# Patient Record
Sex: Female | Born: 1975
Health system: Southern US, Community
[De-identification: ages and names within clinical notes are randomized; demographics above are authoritative.]

## PROBLEM LIST (undated history)

## (undated) DIAGNOSIS — Z8719 Personal history of other diseases of the digestive system: Secondary | ICD-10-CM

## (undated) DIAGNOSIS — Z8619 Personal history of other infectious and parasitic diseases: Secondary | ICD-10-CM

## (undated) HISTORY — DX: Personal history of other diseases of the digestive system: Z87.19

## (undated) HISTORY — DX: Personal history of other infectious and parasitic diseases: Z86.19

## (undated) HISTORY — PX: WISDOM TOOTH EXTRACTION: SHX21

---

## 2011-08-20 DIAGNOSIS — Z8719 Personal history of other diseases of the digestive system: Secondary | ICD-10-CM

## 2011-08-20 HISTORY — DX: Personal history of other diseases of the digestive system: Z87.19

## 2015-01-18 LAB — HM PAP SMEAR

## 2015-02-06 ENCOUNTER — Other Ambulatory Visit: Payer: Self-pay | Admitting: Family Medicine

## 2015-02-06 DIAGNOSIS — Z1231 Encounter for screening mammogram for malignant neoplasm of breast: Secondary | ICD-10-CM

## 2015-02-07 ENCOUNTER — Ambulatory Visit
Admission: RE | Admit: 2015-02-07 | Discharge: 2015-02-07 | Disposition: A | Discharge status: Home / Self Care | Payer: BLUE CROSS/BLUE SHIELD | Source: Ambulatory Visit | Attending: Family Medicine | Admitting: Family Medicine

## 2015-02-07 DIAGNOSIS — Z1231 Encounter for screening mammogram for malignant neoplasm of breast: Secondary | ICD-10-CM

## 2016-03-08 ENCOUNTER — Other Ambulatory Visit: Payer: Self-pay | Admitting: Family Medicine

## 2016-03-08 DIAGNOSIS — Z1231 Encounter for screening mammogram for malignant neoplasm of breast: Secondary | ICD-10-CM

## 2016-03-13 ENCOUNTER — Ambulatory Visit
Admission: RE | Admit: 2016-03-13 | Discharge: 2016-03-13 | Disposition: A | Discharge status: Home / Self Care | Payer: No Typology Code available for payment source | Source: Ambulatory Visit | Attending: Family Medicine | Admitting: Family Medicine

## 2016-03-13 DIAGNOSIS — Z1231 Encounter for screening mammogram for malignant neoplasm of breast: Secondary | ICD-10-CM

## 2016-12-02 ENCOUNTER — Encounter: Payer: Self-pay | Admitting: Family Medicine

## 2016-12-02 ENCOUNTER — Ambulatory Visit (INDEPENDENT_AMBULATORY_CARE_PROVIDER_SITE_OTHER): Payer: BLUE CROSS/BLUE SHIELD | Admitting: Family Medicine

## 2016-12-02 VITALS — BP 110/78 | HR 66 | Temp 98.3°F | Ht 65.5 in | Wt 145.0 lb

## 2016-12-02 DIAGNOSIS — Z0000 Encounter for general adult medical examination without abnormal findings: Secondary | ICD-10-CM | POA: Diagnosis not present

## 2016-12-02 LAB — HEMOGLOBIN A1C: Hgb A1c MFr Bld: 5.5 % (ref 4.6–6.5)

## 2016-12-02 LAB — LIPID PANEL
Cholesterol: 192 mg/dL (ref 0–200)
HDL: 74.8 mg/dL (ref >39.00)
LDL Cholesterol: 99 mg/dL (ref 0–99)
NonHDL: 117.1
Total CHOL/HDL Ratio: 3
Triglycerides: 91 mg/dL (ref 0.0–149.0)
VLDL: 18.2 mg/dL (ref 0.0–40.0)

## 2016-12-02 LAB — COMPREHENSIVE METABOLIC PANEL
ALT: 12 U/L (ref 0–35)
AST: 19 U/L (ref 0–37)
Albumin: 3.9 g/dL (ref 3.5–5.2)
Alkaline Phosphatase: 39 U/L (ref 39–117)
BUN: 9 mg/dL (ref 6–23)
CO2: 26 mEq/L (ref 19–32)
Calcium: 9.1 mg/dL (ref 8.4–10.5)
Chloride: 104 mEq/L (ref 96–112)
Creatinine, Ser: 0.73 mg/dL (ref 0.40–1.20)
GFR: 112.35 mL/min (ref >60.00)
Glucose, Bld: 82 mg/dL (ref 70–99)
Potassium: 4.1 mEq/L (ref 3.5–5.1)
Sodium: 138 mEq/L (ref 135–145)
Total Bilirubin: 0.4 mg/dL (ref 0.2–1.2)
Total Protein: 7.2 g/dL (ref 6.0–8.3)

## 2016-12-02 NOTE — Progress Notes (Signed)
Pre visit review using our clinic review tool, if applicable. No additional management support is needed unless otherwise documented below in the visit note. 

## 2016-12-02 NOTE — Patient Instructions (Signed)
Claritin (loratadine), Allegra (fexofenadine), Zyrtec (cetirizine); these are listed in order from weakest to strongest. Generic, and therefore cheaper, options are in the parentheses.   Flonase (fluticasone); nasal spray that is over the counter. 2 sprays each nostril, once daily. Aim towards the same side eye when you spray.  There are available OTC, and the generic versions, which may be cheaper, are in parentheses. Show this to a pharmacist if you have trouble finding any of these items.  Give [place] 2-3 business days to get the results of your labs back.

## 2016-12-02 NOTE — Progress Notes (Signed)
Chief Complaint  Patient presents with  . Establish Care    pt requesting CPE    New to clinic.  Well Woman Tara Mcdonald is here for a complete physical.   Her last physical was >1 year ago.  Current diet: in general, a "healthy" diet but could be better. Current exercise: walk every day. Weight is stable and she denies daytime fatigue. Patient's last menstrual period was 10/31/2016 (exact date).  Follow with a Dentist? Yes.   Follow with an Optometrist? Yes.   Seatbelt? Yes  Health Maintenance Pap/HPV- Yes - 01/2015 Mammogram- Yes- 726//2017 Tetanus- Yes 01/2015 HIV- Yes  Past Medical History:  Diagnosis Date  . History of chicken pox   . History of fatty infiltration of liver 2013    Past Surgical History:  Procedure Laterality Date  . WISDOM TOOTH EXTRACTION     Medications  Kariva 0.15-0.02/0.01 mg 21/5 tab daily   Allergies Allergies  Allergen Reactions  . Tetracyclines & Related Hives    Review of Systems: Constitutional:  no unexpected change in weight, no weakness, no unexplained fevers, sweats, or chills Eye:  no recent significant change in vision Ear/Nose/Mouth/Throat:  Ears:  no tinnitus or vertigo and no recent change in hearing, Nose/Mouth/Throat:  no complaints of nasal congestion or discharge, no sore throat and no recent change in voice or hoarseness Cardiovascular:  no exercise intolerance, no chest pain, no palpitations Respiratory:  no chronic cough, sputum, or hemoptysis and no shortness of breath Gastrointestinal:  no abdominal pain, no change in bowel habits, no significant change in appetite, no nausea, vomiting, diarrhea, or constipation and no black or bloody stool GU:  Female: negative for dysuria, frequency, and incontinence, Normal menses; no abnormal bleeding, pelvic pain, or discharge Musculoskeletal/Extremities:  no pain, redness, or swelling of the joints Integumentary (Skin/Breast):  no abnormal skin lesions reported, no new breast  lumps or masses Neurologic:  no chronic headaches, no numbness, tingling, or tremor Psychiatric:  no anxiety, no depression Endocrine:  denies fatigue, weight changes, heat/cold intolerance, bowel or skin changes, or cardiovascular system symptoms Hematologic/Lymphatic:  no abnormal bleeding, no HIV risk factors, no night sweats, no swollen nodes, no weight loss Allergic/Immunologic:  no history of food or environmental allergies  Exam BP 110/78 (BP Location: Left Arm, Patient Position: Sitting, Cuff Size: Normal)   Pulse 66   Temp 98.3 F (36.8 C) (Oral)   Ht 5' 5.5" (1.664 m)   Wt 145 lb (65.8 kg)   LMP 10/31/2016 (Exact Date)   SpO2 99%   BMI 23.76 kg/m  General:  well developed, well nourished, in no apparent distress Skin:  no significant moles, warts, or growths Head:  no masses, lesions, or tenderness Eyes:  pupils equal and round, sclera anicteric without injection Ears:  canals without lesions, TMs shiny without retraction, no obvious effusion, no erythema Nose:  nares patent, septum midline, mucosa normal, and no drainage or sinus tenderness Throat/Pharynx:  lips and gingiva without lesion; tongue and uvula midline; non-inflamed pharynx; no exudates or postnasal drainage Neck: neck supple without adenopathy, thyromegaly, or masses Breasts: Declined Thorax:  nontender Lungs:  clear to auscultation, breath sounds equal bilaterally, no respiratory distress Cardio:  regular rate and rhythm without murmurs, heart sounds without clicks or rubs, point of maximal impulse normal; no lifts, heaves, or thrills Abdomen:  abdomen soft, nontender; bowel sounds normal; no masses or organomegaly Genital: Not due Musculoskeletal:  symmetrical muscle groups noted without atrophy or deformity Extremities:  no clubbing,  cyanosis, or edema, no deformities, no skin discoloration Neuro:  gait normal; deep tendon reflexes normal and symmetric Psych: well oriented with normal range of affect and  appropriate judgment/insight  Assessment and Plan  Well adult exam - Plan: Comprehensive metabolic panel, Lipid panel, HgB A1c   Well 41 y.o. female.  Counseled on diet and exercise. Challenged to start lifting weights. Other orders as above. OTC allergy medicine options provided in AVS. Pap next year after June. Will complete co-testing. Follow up in 1 year pending above. The patient voiced understanding and agreement to the plan.  Jilda Roche Anthon, DO 12/02/16 2:10 PM

## 2017-06-25 ENCOUNTER — Other Ambulatory Visit: Payer: Self-pay | Admitting: Family Medicine

## 2017-06-25 DIAGNOSIS — Z1231 Encounter for screening mammogram for malignant neoplasm of breast: Secondary | ICD-10-CM

## 2017-07-24 ENCOUNTER — Ambulatory Visit
Admission: RE | Admit: 2017-07-24 | Discharge: 2017-07-24 | Disposition: A | Discharge status: Home / Self Care | Payer: BLUE CROSS/BLUE SHIELD | Source: Ambulatory Visit | Attending: Family Medicine | Admitting: Family Medicine

## 2017-07-24 DIAGNOSIS — Z1231 Encounter for screening mammogram for malignant neoplasm of breast: Secondary | ICD-10-CM

## 2018-02-27 ENCOUNTER — Other Ambulatory Visit: Payer: Self-pay | Admitting: Family Medicine

## 2018-02-27 ENCOUNTER — Encounter: Payer: Self-pay | Admitting: Family Medicine

## 2018-02-27 ENCOUNTER — Ambulatory Visit (INDEPENDENT_AMBULATORY_CARE_PROVIDER_SITE_OTHER): Payer: BLUE CROSS/BLUE SHIELD | Admitting: Family Medicine

## 2018-02-27 VITALS — BP 139/81 | HR 69 | Ht 65.5 in | Wt 146.1 lb

## 2018-02-27 DIAGNOSIS — Z113 Encounter for screening for infections with a predominantly sexual mode of transmission: Secondary | ICD-10-CM

## 2018-02-27 DIAGNOSIS — Z1151 Encounter for screening for human papillomavirus (HPV): Secondary | ICD-10-CM | POA: Diagnosis not present

## 2018-02-27 DIAGNOSIS — Z01419 Encounter for gynecological examination (general) (routine) without abnormal findings: Secondary | ICD-10-CM | POA: Diagnosis not present

## 2018-02-27 DIAGNOSIS — Z124 Encounter for screening for malignant neoplasm of cervix: Secondary | ICD-10-CM

## 2018-02-27 DIAGNOSIS — Z8742 Personal history of other diseases of the female genital tract: Secondary | ICD-10-CM

## 2018-02-27 NOTE — Progress Notes (Signed)
Hx of abnormal pap smear

## 2018-02-27 NOTE — Progress Notes (Signed)
GYNECOLOGY ANNUAL PREVENTATIVE CARE ENCOUNTER NOTE  Subjective:   Tara Mcdonald is a 42 y.o. G0P0000 female here for a routine annual gynecologic exam.  Current complaints: none.   Denies abnormal vaginal bleeding, discharge, pelvic pain, problems with intercourse or other gynecologic concerns.    Gynecologic History Patient's last menstrual period was 01/27/2018. Patient is sexually active  Last Pap: 2-3 years ago. Results were: normal Does have history of abnormal PAP 5 years ago. Had cryo. Normal since then. Last mammogram: 2018. Results were: normal. Cat D density.  Obstetric History OB History  Gravida Para Term Preterm AB Living  0 0 0 0 0 0  SAB TAB Ectopic Multiple Live Births  0 0 0 0 0    Past Medical History:  Diagnosis Date  . History of chicken pox   . History of fatty infiltration of liver 2013    Past Surgical History:  Procedure Laterality Date  . WISDOM TOOTH EXTRACTION      Current Outpatient Medications on File Prior to Visit  Medication Sig Dispense Refill  . desogestrel-ethinyl estradiol (KARIVA,AZURETTE,MIRCETTE) 0.15-0.02/0.01 MG (21/5) tablet Take 1 tablet by mouth daily.     No current facility-administered medications on file prior to visit.     Allergies  Allergen Reactions  . Tetracyclines & Related Hives    Social History   Socioeconomic History  . Marital status: Single    Spouse name: Not on file  . Number of children: Not on file  . Years of education: Not on file  . Highest education level: Not on file  Occupational History  . Not on file  Social Needs  . Financial resource strain: Not on file  . Food insecurity:    Worry: Not on file    Inability: Not on file  . Transportation needs:    Medical: Not on file    Non-medical: Not on file  Tobacco Use  . Smoking status: Never Smoker  . Smokeless tobacco: Never Used  Substance and Sexual Activity  . Alcohol use: No  . Drug use: No  . Sexual activity: Yes    Birth  control/protection: Pill  Lifestyle  . Physical activity:    Days per week: Not on file    Minutes per session: Not on file  . Stress: Not on file  Relationships  . Social connections:    Talks on phone: Not on file    Gets together: Not on file    Attends religious service: Not on file    Active member of club or organization: Not on file    Attends meetings of clubs or organizations: Not on file    Relationship status: Not on file  . Intimate partner violence:    Fear of current or ex partner: Not on file    Emotionally abused: Not on file    Physically abused: Not on file    Forced sexual activity: Not on file  Other Topics Concern  . Not on file  Social History Narrative  . Not on file    Family History  Problem Relation Age of Onset  . Hypertension Mother   . Hypertension Father   . Hypertension Brother   . Breast cancer Neg Hx     The following portions of the patient's history were reviewed and updated as appropriate: allergies, current medications, past family history, past medical history, past social history, past surgical history and problem list.  Review of Systems Pertinent items noted in HPI and  remainder of comprehensive ROS otherwise negative.   Objective:  BP 139/81   Pulse 69   Ht 5' 5.5" (1.664 m)   Wt 146 lb 1.3 oz (66.3 kg)   LMP 01/27/2018   BMI 23.94 kg/m  CONSTITUTIONAL: Well-developed, well-nourished female in no acute distress.  HENT:  Normocephalic, atraumatic, External right and left ear normal. Oropharynx is clear and moist EYES: Conjunctivae and EOM are normal. Pupils are equal, round, and reactive to light. No scleral icterus.  NECK: Normal range of motion, supple, no masses.  Normal thyroid.   CARDIOVASCULAR: Normal heart rate noted, regular rhythm RESPIRATORY: Clear to auscultation bilaterally. Effort and breath sounds normal, no problems with respiration noted. BREASTS: Symmetric in size. No masses, skin changes, nipple drainage,  or lymphadenopathy. ABDOMEN: Soft, normal bowel sounds, no distention noted.  No tenderness, rebound or guarding.  PELVIC: Normal appearing external genitalia; normal appearing vaginal mucosa and cervix.  No abnormal discharge noted.  Pap smear obtained.  Normal uterine size, no other palpable masses, no uterine or adnexal tenderness. MUSCULOSKELETAL: Normal range of motion. No tenderness.  No cyanosis, clubbing, or edema.  2+ distal pulses. SKIN: Skin is warm and dry. No rash noted. Not diaphoretic. No erythema. No pallor. NEUROLOGIC: Alert and oriented to person, place, and time. Normal reflexes, muscle tone coordination. No cranial nerve deficit noted. PSYCHIATRIC: Normal mood and affect. Normal behavior. Normal judgment and thought content.  Assessment:  Annual gynecologic examination with pap smear   Plan:  1. Well Woman Exam Will follow up results of pap smear and manage accordingly. Mammogram scheduled STD testing discussed. Patient requested testing: Vaginal and serum testing.  - Cytology - PAP - Comp Met (CMET) - CBC - TSH - Lipid panel - Hepatitis C antibody - Hepatitis B surface antigen - HIV antibody - RPR  2. H/o Abnormal PAP Will get old records to see what old result was.  Routine preventative health maintenance measures emphasized. Please refer to After Visit Summary for other counseling recommendations.    Loma [place], [place] for [employer]

## 2018-02-27 NOTE — Patient Instructions (Signed)

## 2018-02-28 LAB — CBC
Hematocrit: 40.1 % (ref 34.0–46.6)
Hemoglobin: 13.2 g/dL (ref 11.1–15.9)
MCH: 29.4 pg (ref 26.6–33.0)
MCHC: 32.9 g/dL (ref 31.5–35.7)
MCV: 89 fL (ref 79–97)
Platelets: 207 10*3/uL (ref 150–450)
RBC: 4.49 x10E6/uL (ref 3.77–5.28)
RDW: 13.8 % (ref 12.3–15.4)
WBC: 6.6 10*3/uL (ref 3.4–10.8)

## 2018-02-28 LAB — COMPREHENSIVE METABOLIC PANEL
ALT: 19 IU/L (ref 0–32)
AST: 21 IU/L (ref 0–40)
Albumin/Globulin Ratio: 1.7 (ref 1.2–2.2)
Albumin: 4.3 g/dL (ref 3.5–5.5)
Alkaline Phosphatase: 48 IU/L (ref 39–117)
BUN/Creatinine Ratio: 10 (ref 9–23)
BUN: 8 mg/dL (ref 6–24)
Bilirubin Total: 0.3 mg/dL (ref 0.0–1.2)
CO2: 21 mmol/L (ref 20–29)
Calcium: 8.9 mg/dL (ref 8.7–10.2)
Chloride: 103 mmol/L (ref 96–106)
Creatinine, Ser: 0.81 mg/dL (ref 0.57–1.00)
GFR calc Af Amer: 103 mL/min/{1.73_m2} (ref >59)
GFR calc non Af Amer: 89 mL/min/{1.73_m2} (ref >59)
Globulin, Total: 2.5 g/dL (ref 1.5–4.5)
Glucose: 83 mg/dL (ref 65–99)
Potassium: 4.5 mmol/L (ref 3.5–5.2)
Sodium: 138 mmol/L (ref 134–144)
Total Protein: 6.8 g/dL (ref 6.0–8.5)

## 2018-02-28 LAB — HEPATITIS B SURFACE ANTIGEN: Hepatitis B Surface Ag: NEGATIVE

## 2018-02-28 LAB — LIPID PANEL
Chol/HDL Ratio: 2.2 ratio (ref 0.0–4.4)
Cholesterol, Total: 183 mg/dL (ref 100–199)
HDL: 85 mg/dL (ref >39)
LDL Calculated: 83 mg/dL (ref 0–99)
Triglycerides: 74 mg/dL (ref 0–149)
VLDL Cholesterol Cal: 15 mg/dL (ref 5–40)

## 2018-02-28 LAB — RPR: RPR Ser Ql: NONREACTIVE

## 2018-02-28 LAB — TSH: TSH: 1.68 u[IU]/mL (ref 0.450–4.500)

## 2018-02-28 LAB — HEPATITIS C ANTIBODY: Hep C Virus Ab: <0.1 s/co ratio (ref 0.0–0.9)

## 2018-02-28 LAB — HIV ANTIBODY (ROUTINE TESTING W REFLEX): HIV Screen 4th Generation wRfx: NONREACTIVE

## 2018-03-03 LAB — CYTOLOGY - PAP
Chlamydia: NEGATIVE
Diagnosis: NEGATIVE
HPV: NOT DETECTED
Neisseria Gonorrhea: NEGATIVE
Trichomonas: NEGATIVE

## 2018-09-18 ENCOUNTER — Other Ambulatory Visit: Payer: Self-pay | Admitting: Family Medicine

## 2018-09-18 DIAGNOSIS — Z1231 Encounter for screening mammogram for malignant neoplasm of breast: Secondary | ICD-10-CM

## 2018-10-02 ENCOUNTER — Ambulatory Visit
Admission: RE | Admit: 2018-10-02 | Discharge: 2018-10-02 | Disposition: A | Discharge status: Home / Self Care | Payer: BLUE CROSS/BLUE SHIELD | Source: Ambulatory Visit

## 2018-10-02 DIAGNOSIS — Z1231 Encounter for screening mammogram for malignant neoplasm of breast: Secondary | ICD-10-CM

## 2018-10-06 ENCOUNTER — Other Ambulatory Visit: Payer: Self-pay | Admitting: Family Medicine

## 2018-10-06 DIAGNOSIS — R928 Other abnormal and inconclusive findings on diagnostic imaging of breast: Secondary | ICD-10-CM

## 2018-10-07 ENCOUNTER — Telehealth: Payer: Self-pay | Admitting: *Deleted

## 2018-10-07 NOTE — Telephone Encounter (Signed)
Received Physician Orders from [facility]; forwarded to provider/SLS 02/19 

## 2018-10-09 ENCOUNTER — Ambulatory Visit
Admission: RE | Admit: 2018-10-09 | Discharge: 2018-10-09 | Disposition: A | Discharge status: Home / Self Care | Payer: BLUE CROSS/BLUE SHIELD | Source: Ambulatory Visit | Attending: Family Medicine | Admitting: Family Medicine

## 2018-10-09 DIAGNOSIS — R928 Other abnormal and inconclusive findings on diagnostic imaging of breast: Secondary | ICD-10-CM

## 2018-10-09 DIAGNOSIS — N6001 Solitary cyst of right breast: Secondary | ICD-10-CM | POA: Diagnosis not present

## 2018-10-09 DIAGNOSIS — R922 Inconclusive mammogram: Secondary | ICD-10-CM | POA: Diagnosis not present

## 2019-04-07 ENCOUNTER — Other Ambulatory Visit: Payer: Self-pay

## 2019-04-07 MED ORDER — DESOGESTREL-ETHINYL ESTRADIOL 0.15-0.02/0.01 MG (21/5) PO TABS: 1.0000 | ORAL_TABLET | Freq: Every day | ORAL | 11 refills | Status: AC

## 2020-05-16 ENCOUNTER — Ambulatory Visit: Payer: Managed Care, Other (non HMO)

## 2020-05-16 ENCOUNTER — Other Ambulatory Visit (HOSPITAL_COMMUNITY): Payer: Self-pay | Admitting: Family Medicine

## 2020-05-16 DIAGNOSIS — Z1231 Encounter for screening mammogram for malignant neoplasm of breast: Secondary | ICD-10-CM

## 2020-05-19 ENCOUNTER — Ambulatory Visit (HOSPITAL_COMMUNITY)
Admission: RE | Admit: 2020-05-19 | Discharge: 2020-05-19 | Disposition: A | Discharge status: Home / Self Care | Payer: Managed Care, Other (non HMO) | Source: Ambulatory Visit | Attending: Family Medicine | Admitting: Family Medicine

## 2020-05-19 ENCOUNTER — Other Ambulatory Visit: Payer: Self-pay

## 2020-05-19 DIAGNOSIS — Z1231 Encounter for screening mammogram for malignant neoplasm of breast: Secondary | ICD-10-CM | POA: Insufficient documentation

## 2020-05-25 ENCOUNTER — Ambulatory Visit: Payer: Managed Care, Other (non HMO) | Attending: Internal Medicine

## 2020-05-25 DIAGNOSIS — Z23 Encounter for immunization: Secondary | ICD-10-CM

## 2020-05-25 NOTE — Progress Notes (Signed)
   Covid-19 Vaccination Clinic  Name:  Tara Mcdonald    MRN: [id number] DOB: [birth year]  05/25/2020  [name] was observed post Covid-19 immunization for 15 minutes without incident. She was provided with Vaccine Information Sheet and instruction to access the V-Safe system.   [name] was instructed to call 911 with any severe reactions post vaccine: Marland Kitchen Difficulty breathing  . Swelling of face and throat  . A fast heartbeat  . A bad rash all over body  . Dizziness and weakness   Immunizations Administered    Name Date Dose VIS Date Route   JANSSEN COVID-19 VACCINE 05/25/2020  9:20 AM 0.5 mL 10/16/2019 Intramuscular   Manufacturer: [employer]   Lot: [id number]   NDC: 62376-283-15

## 2020-10-19 ENCOUNTER — Ambulatory Visit: Payer: BC Managed Care – PPO | Admitting: Internal Medicine

## 2020-10-20 ENCOUNTER — Ambulatory Visit: Payer: BC Managed Care – PPO | Admitting: Internal Medicine

## 2020-10-24 ENCOUNTER — Ambulatory Visit: Payer: BC Managed Care – PPO | Admitting: Family Medicine

## 2020-10-24 ENCOUNTER — Encounter: Payer: Self-pay | Admitting: Nurse Practitioner

## 2020-10-24 ENCOUNTER — Other Ambulatory Visit: Payer: Self-pay

## 2020-10-24 ENCOUNTER — Ambulatory Visit: Payer: BC Managed Care – PPO | Admitting: Nurse Practitioner

## 2020-10-24 DIAGNOSIS — R030 Elevated blood-pressure reading, without diagnosis of hypertension: Secondary | ICD-10-CM | POA: Diagnosis not present

## 2020-10-24 DIAGNOSIS — Z13220 Encounter for screening for lipoid disorders: Secondary | ICD-10-CM | POA: Diagnosis not present

## 2020-10-24 DIAGNOSIS — K7581 Nonalcoholic steatohepatitis (NASH): Secondary | ICD-10-CM

## 2020-10-24 DIAGNOSIS — Z0000 Encounter for general adult medical examination without abnormal findings: Secondary | ICD-10-CM | POA: Diagnosis not present

## 2020-10-24 NOTE — Progress Notes (Signed)
Established Patient Office Visit  Subjective:  Patient ID: Tara Mcdonald, female    DOB: Jul 16, 1976  Age: 45 y.o. MRN: [id number]  CC:  Chief Complaint  Patient presents with  . New Patient (Initial Visit)    HPI Tara Mcdonald presents for physical exam. No acute concerns.  Past Medical History:  Diagnosis Date  . History of chicken pox   . History of fatty infiltration of liver 2013    Past Surgical History:  Procedure Laterality Date  . WISDOM TOOTH EXTRACTION      Family History  Problem Relation Age of Onset  . Hypertension Mother   . Hypertension Father   . Hypertension Brother   . Breast cancer Neg Hx     Social History   Socioeconomic History  . Marital status: Single    Spouse name: Not on file  . Number of children: Not on file  . Years of education: Not on file  . Highest education level: Not on file  Occupational History  . Not on file  Tobacco Use  . Smoking status: Never Smoker  . Smokeless tobacco: Never Used  Substance and Sexual Activity  . Alcohol use: No  . Drug use: No  . Sexual activity: Yes    Birth control/protection: Pill  Other Topics Concern  . Not on file  Social History Narrative  . Not on file   Social Determinants of Health   Financial Resource Strain: Not on file  Food Insecurity: Not on file  Transportation Needs: Not on file  Physical Activity: Not on file  Stress: Not on file  Social Connections: Not on file  Intimate Partner Violence: Not on file    Outpatient Medications Prior to Visit  Medication Sig Dispense Refill  . desogestrel-ethinyl estradiol (MIRCETTE) 0.15-0.02/0.01 MG (21/5) tablet Take 1 tablet by mouth daily. 1 Package 11   No facility-administered medications prior to visit.    Allergies  Allergen Reactions  . Tetracyclines & Related Hives  . Other Hives and Itching    ROS Review of Systems  Constitutional: Negative.   HENT: Negative.   Eyes: Negative.   Respiratory: Negative.    Cardiovascular: Negative.   Gastrointestinal: Negative.   Endocrine: Negative.   Genitourinary: Negative.   Musculoskeletal: Negative.   Skin: Negative.   Allergic/Immunologic: Negative.   Neurological: Negative.   Hematological: Negative.   Psychiatric/Behavioral: Negative.       Objective:    Physical Exam Constitutional:      Appearance: Normal appearance.  HENT:     Head: Normocephalic and atraumatic.     Right Ear: Tympanic membrane, ear canal and external ear normal.     Left Ear: Tympanic membrane, ear canal and external ear normal.     Nose: Nose normal.     Mouth/Throat:     Mouth: Mucous membranes are dry.     Pharynx: Oropharynx is clear.  Eyes:     Extraocular Movements: Extraocular movements intact.     Conjunctiva/sclera: Conjunctivae normal.     Pupils: Pupils are equal, round, and reactive to light.  Cardiovascular:     Rate and Rhythm: Normal rate and regular rhythm.     Pulses: Normal pulses.     Heart sounds: Normal heart sounds.  Pulmonary:     Effort: Pulmonary effort is normal.     Breath sounds: Normal breath sounds.  Abdominal:     General: Abdomen is flat. Bowel sounds are normal.     Palpations: Abdomen is soft.  Musculoskeletal:        General: Normal range of motion.     Cervical back: Normal range of motion and neck supple.  Skin:    General: Skin is warm and dry.     Capillary Refill: Capillary refill takes less than 2 seconds.  Neurological:     General: No focal deficit present.     Mental Status: She is alert and oriented to person, place, and time.  Psychiatric:        Mood and Affect: Mood normal.        Behavior: Behavior normal.        Thought Content: Thought content normal.        Judgment: Judgment normal.     BP 140/90   Pulse 69   Temp 97.7 F (36.5 C)   Resp 18   Ht '5\' 6"'  (1.676 m)   Wt 156 lb (70.8 kg)   SpO2 99%   BMI 25.18 kg/m  Wt Readings from Last 3 Encounters:  10/24/20 156 lb (70.8 kg)  02/27/18  146 lb 1.3 oz (66.3 kg)  12/02/16 145 lb (65.8 kg)     There are no preventive care reminders to display for this patient.  There are no preventive care reminders to display for this patient.  Lab Results  Component Value Date   TSH 1.680 02/27/2018   Lab Results  Component Value Date   WBC 6.6 02/27/2018   HGB 13.2 02/27/2018   HCT 40.1 02/27/2018   MCV 89 02/27/2018   PLT 207 02/27/2018   Lab Results  Component Value Date   NA 138 02/27/2018   K 4.5 02/27/2018   CO2 21 02/27/2018   GLUCOSE 83 02/27/2018   BUN 8 02/27/2018   CREATININE 0.81 02/27/2018   BILITOT 0.3 02/27/2018   ALKPHOS 48 02/27/2018   AST 21 02/27/2018   ALT 19 02/27/2018   PROT 6.8 02/27/2018   ALBUMIN 4.3 02/27/2018   CALCIUM 8.9 02/27/2018   GFR 112.35 12/02/2016   Lab Results  Component Value Date   CHOL 183 02/27/2018   Lab Results  Component Value Date   HDL 85 02/27/2018   Lab Results  Component Value Date   LDLCALC 83 02/27/2018   Lab Results  Component Value Date   TRIG 74 02/27/2018   Lab Results  Component Value Date   CHOLHDL 2.2 02/27/2018   Lab Results  Component Value Date   HGBA1C 5.5 12/02/2016      Assessment & Plan:   Problem List Items Addressed This Visit      Digestive   NASH (nonalcoholic steatohepatitis)     Other   General medical exam    -no acute concerns today -physical exam unremarkable -screenings up to date      Relevant Orders   CBC with Differential/Platelet   CMP14+EGFR   Lipid Panel With LDL/HDL Ratio   Elevated BP without diagnosis of hypertension    -BP 140/90 today -she states she is nervous and her home BP readings are usually around 110/70 -no meds started today         No orders of the defined types were placed in this encounter.   Follow-up: Return in about 1 year (around 10/24/2021) for Physical Exam.    Noreene Larsson, NP

## 2020-10-24 NOTE — Assessment & Plan Note (Signed)
-  no acute concerns today -physical exam unremarkable -screenings up to date

## 2020-10-24 NOTE — Assessment & Plan Note (Signed)
-  BP 140/90 today -she states she is nervous and her home BP readings are usually around 110/70 -no meds started today

## 2020-10-24 NOTE — Patient Instructions (Signed)

## 2020-10-25 LAB — CMP14+EGFR
ALT: 15 IU/L (ref 0–32)
AST: 20 IU/L (ref 0–40)
Albumin/Globulin Ratio: 1.5 (ref 1.2–2.2)
Albumin: 4.1 g/dL (ref 3.8–4.8)
Alkaline Phosphatase: 49 IU/L (ref 44–121)
BUN/Creatinine Ratio: 9 (ref 9–23)
BUN: 7 mg/dL (ref 6–24)
Bilirubin Total: <0.2 mg/dL (ref 0.0–1.2)
CO2: 22 mmol/L (ref 20–29)
Calcium: 8.6 mg/dL — ABNORMAL LOW (ref 8.7–10.2)
Chloride: 105 mmol/L (ref 96–106)
Creatinine, Ser: 0.76 mg/dL (ref 0.57–1.00)
Globulin, Total: 2.8 g/dL (ref 1.5–4.5)
Glucose: 77 mg/dL (ref 65–99)
Potassium: 4.1 mmol/L (ref 3.5–5.2)
Sodium: 142 mmol/L (ref 134–144)
Total Protein: 6.9 g/dL (ref 6.0–8.5)
eGFR: 98 mL/min/{1.73_m2} (ref >59)

## 2020-10-25 LAB — CBC WITH DIFFERENTIAL/PLATELET
Basophils Absolute: 0.1 10*3/uL (ref 0.0–0.2)
Basos: 1 %
EOS (ABSOLUTE): 0.2 10*3/uL (ref 0.0–0.4)
Eos: 3 %
Hematocrit: 40 % (ref 34.0–46.6)
Hemoglobin: 13.5 g/dL (ref 11.1–15.9)
Immature Grans (Abs): 0 10*3/uL (ref 0.0–0.1)
Immature Granulocytes: 1 %
Lymphocytes Absolute: 1.4 10*3/uL (ref 0.7–3.1)
Lymphs: 22 %
MCH: 29.6 pg (ref 26.6–33.0)
MCHC: 33.8 g/dL (ref 31.5–35.7)
MCV: 88 fL (ref 79–97)
Monocytes Absolute: 0.6 10*3/uL (ref 0.1–0.9)
Monocytes: 9 %
Neutrophils Absolute: 4.3 10*3/uL (ref 1.4–7.0)
Neutrophils: 64 %
Platelets: 187 10*3/uL (ref 150–450)
RBC: 4.56 x10E6/uL (ref 3.77–5.28)
RDW: 12.1 % (ref 11.7–15.4)
WBC: 6.6 10*3/uL (ref 3.4–10.8)

## 2020-10-25 LAB — LIPID PANEL WITH LDL/HDL RATIO
Cholesterol, Total: 210 mg/dL — ABNORMAL HIGH (ref 100–199)
HDL: 82 mg/dL (ref >39)
LDL Chol Calc (NIH): 111 mg/dL — ABNORMAL HIGH (ref 0–99)
LDL/HDL Ratio: 1.4 ratio (ref 0.0–3.2)
Triglycerides: 97 mg/dL (ref 0–149)
VLDL Cholesterol Cal: 17 mg/dL (ref 5–40)

## 2020-10-25 NOTE — Progress Notes (Signed)
Liver function looks great. Her LDL, or bad cholesterol is just a little elevated. We can discuss medications vs lifestyle changes at her next appt.

## 2021-07-04 ENCOUNTER — Other Ambulatory Visit (HOSPITAL_COMMUNITY): Payer: Self-pay | Admitting: General Practice

## 2021-07-04 DIAGNOSIS — Z1231 Encounter for screening mammogram for malignant neoplasm of breast: Secondary | ICD-10-CM

## 2021-07-09 ENCOUNTER — Other Ambulatory Visit: Payer: Self-pay

## 2021-07-09 ENCOUNTER — Ambulatory Visit (HOSPITAL_COMMUNITY)
Admission: RE | Admit: 2021-07-09 | Discharge: 2021-07-09 | Disposition: A | Discharge status: Home / Self Care | Payer: BC Managed Care – PPO | Source: Ambulatory Visit | Attending: Nurse Practitioner | Admitting: Nurse Practitioner

## 2021-07-09 DIAGNOSIS — Z1231 Encounter for screening mammogram for malignant neoplasm of breast: Secondary | ICD-10-CM | POA: Diagnosis not present

## 2021-07-25 NOTE — Progress Notes (Signed)
Mammogram negative. Repeat in 1 year.

## 2021-10-25 ENCOUNTER — Encounter: Payer: BC Managed Care – PPO | Admitting: Nurse Practitioner

## 2021-11-08 ENCOUNTER — Encounter: Payer: BC Managed Care – PPO | Admitting: Nurse Practitioner

## 2021-12-25 ENCOUNTER — Ambulatory Visit: Payer: BC Managed Care – PPO | Admitting: Obstetrics and Gynecology

## 2021-12-25 ENCOUNTER — Other Ambulatory Visit (HOSPITAL_COMMUNITY)
Admission: RE | Admit: 2021-12-25 | Discharge: 2021-12-25 | Disposition: A | Discharge status: Home / Self Care | Payer: BC Managed Care – PPO | Source: Ambulatory Visit | Attending: Obstetrics and Gynecology | Admitting: Obstetrics and Gynecology

## 2021-12-25 ENCOUNTER — Other Ambulatory Visit: Payer: Self-pay | Admitting: Obstetrics and Gynecology

## 2021-12-25 ENCOUNTER — Encounter: Payer: Self-pay | Admitting: Obstetrics and Gynecology

## 2021-12-25 VITALS — BP 124/81 | HR 60 | Ht 66.0 in | Wt 149.0 lb

## 2021-12-25 DIAGNOSIS — Z01419 Encounter for gynecological examination (general) (routine) without abnormal findings: Secondary | ICD-10-CM | POA: Diagnosis not present

## 2021-12-25 DIAGNOSIS — Z113 Encounter for screening for infections with a predominantly sexual mode of transmission: Secondary | ICD-10-CM | POA: Diagnosis not present

## 2021-12-25 NOTE — Progress Notes (Signed)
Irreg cycles now, pain in joints, tiredness with cycle ?Pap due ?Requests STI testing swab and blood ?Depression and anxiety screen negative ?

## 2021-12-25 NOTE — Progress Notes (Signed)
Subjective:  ?  ? Tara Mcdonald is a 46 y.o. female P0 with LMP 11/15/21 and BMI 24 who is here for a comprehensive physical exam. The patient reports no complaints. She reports irregular menses, often skipping months since March. She is sexually active without contraception or complaints. She denies pelvic pain or abnormal discharge. She endorses occasional vasomotor symptoms. Patient denies urinary incontinence. She reports regular bowel movements. Patient is without any complaints ? ?Past Medical History:  ?Diagnosis Date  ? History of chicken pox   ? History of fatty infiltration of liver 2013  ? ?Past Surgical History:  ?Procedure Laterality Date  ? WISDOM TOOTH EXTRACTION    ? ?Family History  ?Problem Relation Age of Onset  ? Hypertension Mother   ? Hypertension Father   ? Hypertension Brother   ? Breast cancer Neg Hx   ?  ? ?Social History  ? ?Socioeconomic History  ? Marital status: Single  ?  Spouse name: Not on file  ? Number of children: Not on file  ? Years of education: Not on file  ? Highest education level: Not on file  ?Occupational History  ? Not on file  ?Tobacco Use  ? Smoking status: Never  ? Smokeless tobacco: Never  ?Substance and Sexual Activity  ? Alcohol use: No  ? Drug use: No  ? Sexual activity: Yes  ?  Birth control/protection: Pill  ?Other Topics Concern  ? Not on file  ?Social History Narrative  ? Not on file  ? ?Social Determinants of Health  ? ?Financial Resource Strain: Not on file  ?Food Insecurity: Not on file  ?Transportation Needs: Not on file  ?Physical Activity: Not on file  ?Stress: Not on file  ?Social Connections: Not on file  ?Intimate Partner Violence: Not on file  ? ?Health Maintenance  ?Topic Date Due  ? COVID-19 Vaccine (3 - Booster for Janssen series) 12/16/2020  ? PAP SMEAR-Modifier  02/27/2021  ? COLONOSCOPY (Pts 45-4yrs Insurance coverage will need to be confirmed)  10/03/2024 (Originally 10/04/2019)  ? INFLUENZA VACCINE  03/19/2022  ? TETANUS/TDAP  01/17/2025  ?  Hepatitis C Screening  Completed  ? HIV Screening  Completed  ? HPV VACCINES  Aged Out  ? ? ?  ? ?Review of Systems ?Pertinent items noted in HPI and remainder of comprehensive ROS otherwise negative.  ? ?Objective:  ?Height 5\' 6"  (1.676 m), weight 149 lb (67.6 kg), last menstrual period 11/15/2021. ? ? ? GENERAL: Well-developed, well-nourished female in no acute distress.  ?HEENT: Normocephalic, atraumatic. Sclerae anicteric.  ?NECK: Supple. Normal thyroid.  ?LUNGS: Clear to auscultation bilaterally.  ?HEART: Regular rate and rhythm. ?BREASTS: Symmetric in size. No palpable masses or lymphadenopathy, skin changes, or nipple drainage. ?ABDOMEN: Soft, nontender, nondistended. No organomegaly. ?PELVIC: Normal external female genitalia. Vagina is pink and rugated.  Normal discharge. Normal appearing cervix. Uterus is normal in size. No adnexal mass or tenderness. Chaperone present during the pelvic exam ?EXTREMITIES: No cyanosis, clubbing, or edema, 2+ distal pulses. ?  ?  ?Assessment:  ? ? Healthy female exam.    ?  ?Plan:  ? ? Pap smear collected ?Screening mammogram due 06/2022 ?STI screening per patient request ?Patient will be contacted with abnormal results ?See After Visit Summary for Counseling Recommendations  ? ?

## 2021-12-26 LAB — CERVICOVAGINAL ANCILLARY ONLY
Chlamydia: NEGATIVE
Comment: NEGATIVE
Comment: NEGATIVE
Comment: NORMAL
Neisseria Gonorrhea: NEGATIVE
Trichomonas: NEGATIVE

## 2021-12-26 LAB — HEPATITIS B SURFACE ANTIGEN: Hepatitis B Surface Ag: NEGATIVE

## 2021-12-26 LAB — HIV ANTIBODY (ROUTINE TESTING W REFLEX): HIV Screen 4th Generation wRfx: NONREACTIVE

## 2021-12-26 LAB — RPR: RPR Ser Ql: NONREACTIVE

## 2021-12-28 LAB — CYTOLOGY - PAP
Comment: NEGATIVE
Diagnosis: NEGATIVE
High risk HPV: NEGATIVE

## 2022-06-12 ENCOUNTER — Other Ambulatory Visit (HOSPITAL_COMMUNITY): Payer: Self-pay | Admitting: Obstetrics and Gynecology

## 2022-06-12 DIAGNOSIS — Z1231 Encounter for screening mammogram for malignant neoplasm of breast: Secondary | ICD-10-CM

## 2022-07-05 ENCOUNTER — Ambulatory Visit: Payer: BC Managed Care – PPO | Admitting: Podiatry

## 2022-07-05 ENCOUNTER — Ambulatory Visit (HOSPITAL_COMMUNITY)
Admission: RE | Admit: 2022-07-05 | Discharge: 2022-07-05 | Disposition: A | Discharge status: Home / Self Care | Payer: BC Managed Care – PPO | Source: Ambulatory Visit | Attending: Obstetrics and Gynecology | Admitting: Obstetrics and Gynecology

## 2022-07-05 ENCOUNTER — Telehealth: Payer: Self-pay | Admitting: *Deleted

## 2022-07-05 DIAGNOSIS — Z1231 Encounter for screening mammogram for malignant neoplasm of breast: Secondary | ICD-10-CM | POA: Diagnosis not present

## 2022-07-05 DIAGNOSIS — M722 Plantar fascial fibromatosis: Secondary | ICD-10-CM

## 2022-07-05 NOTE — Telephone Encounter (Signed)
Patient is possible having an allergic reaction to injection given today,(redness, itching, rash but no shortness of breath noticed). She is traveling and will take some benadryl but wanted the physician's recommendations.

## 2022-07-05 NOTE — Progress Notes (Unsigned)
  Subjective:  Patient ID: Tara Mcdonald, female    DOB: [birth year],  MRN: [id number]  Chief Complaint  Patient presents with   Foot Pain    Pt stated that she is having some discomfort with her heels when she sits cross legged     46 y.o. female presents with the above complaint.  Patient presents with left heel pain that has been normal for quite some time is progressive gotten worse worse with ambulation worse with pressure.  She would like to discuss treatment options for it.  It is sharp shooting in nature pain scale is 7 out of 10.  She is tried some over-the-counter stuff none of which has helped.  She would like to discuss treatment options for it.   Review of Systems: Negative except as noted in the HPI. Denies N/V/F/Ch.  Past Medical History:  Diagnosis Date   History of chicken pox    History of fatty infiltration of liver 2013    Current Outpatient Medications:    desogestrel-ethinyl estradiol (MIRCETTE) 0.15-0.02/0.01 MG (21/5) tablet, Take 1 tablet by mouth daily., Disp: 1 Package, Rfl: 11  Social History   Tobacco Use  Smoking Status Never  Smokeless Tobacco Never    Allergies  Allergen Reactions   Tetracyclines & Related Hives   Other Hives and Itching   Objective:  There were no vitals filed for this visit. There is no height or weight on file to calculate BMI. Constitutional Well developed. Well nourished.  Vascular Dorsalis pedis pulses palpable bilaterally. Posterior tibial pulses palpable bilaterally. Capillary refill normal to all digits.  No cyanosis or clubbing noted. Pedal hair growth normal.  Neurologic Normal speech. Oriented to person, place, and time. Epicritic sensation to light touch grossly present bilaterally.  Dermatologic Nails well groomed and normal in appearance. No open wounds. No skin lesions.  Orthopedic: Normal joint ROM without pain or crepitus bilaterally. No visible deformities. Tender to palpation at the calcaneal tuber  left. No pain with calcaneal squeeze left. Ankle ROM diminished range of motion left. Silfverskiold Test: positive left.   Radiographs: None  Assessment:   1. Plantar fasciitis of left foot    Plan:  Patient was evaluated and treated and all questions answered.  Plantar Fasciitis, left - XR reviewed as above.  - Educated on icing and stretching. Instructions given.  - Injection delivered to the plantar fascia as below. - DME: Plantar fascial brace dispensed to support the medial longitudinal arch of the foot and offload pressure from the heel and prevent arch collapse during weightbearing - Pharmacologic management: None  Procedure: Injection Tendon/Ligament Location: Left plantar fascia at the glabrous junction; medial approach. Skin Prep: alcohol Injectate: 0.5 cc 0.5% marcaine plain, 0.5 cc of 1% Lidocaine, 0.5 cc kenalog 10. Disposition: Patient tolerated procedure well. Injection site dressed with a band-aid.  No follow-ups on file.

## 2022-07-05 NOTE — Telephone Encounter (Signed)
Dr. Eliane Decree patient. Will defer to him. I am not on call today. - Dr. Logan Bores

## 2022-12-25 ENCOUNTER — Ambulatory Visit: Payer: BC Managed Care – PPO | Admitting: Family Medicine

## 2023-03-13 IMAGING — MG MM DIGITAL SCREENING BILAT W/ TOMO AND CAD
6 of 10 series · 6 of 30 positions shown · non-contrast
Comparison: Previous exam(s).

CLINICAL DATA: Screening.

EXAM:
DIGITAL SCREENING BILATERAL MAMMOGRAM WITH TOMOSYNTHESIS AND CAD
TECHNIQUE: Bilateral screening digital craniocaudal and mediolateral oblique
mammograms were obtained. Bilateral screening digital breast
tomosynthesis was performed. The images were evaluated with
computer-aided detection.

[L MLO synth-2D]
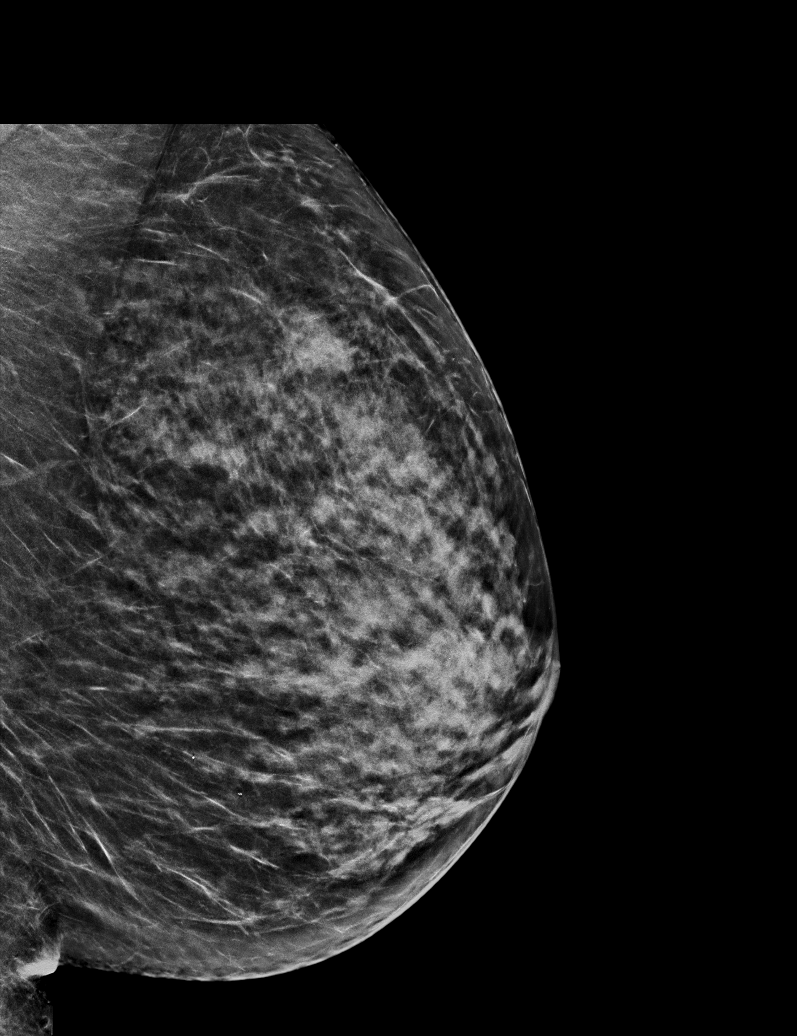

[R CC synth-2D]
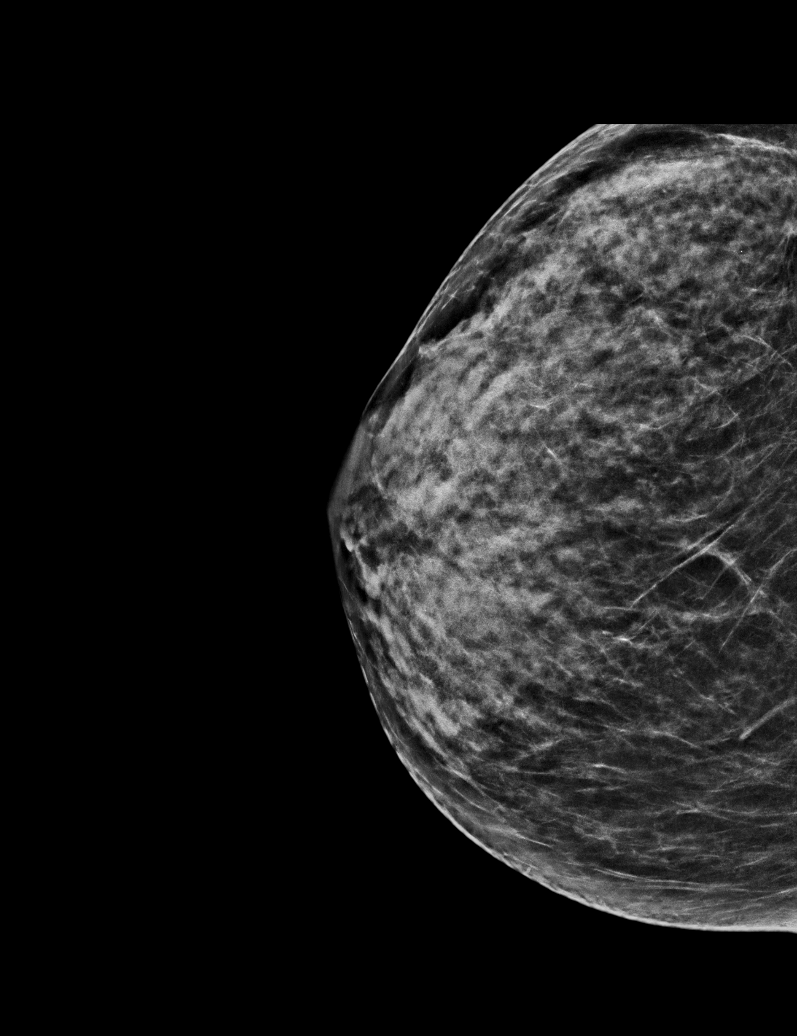

[R MLO synth-2D (1 of 2)]
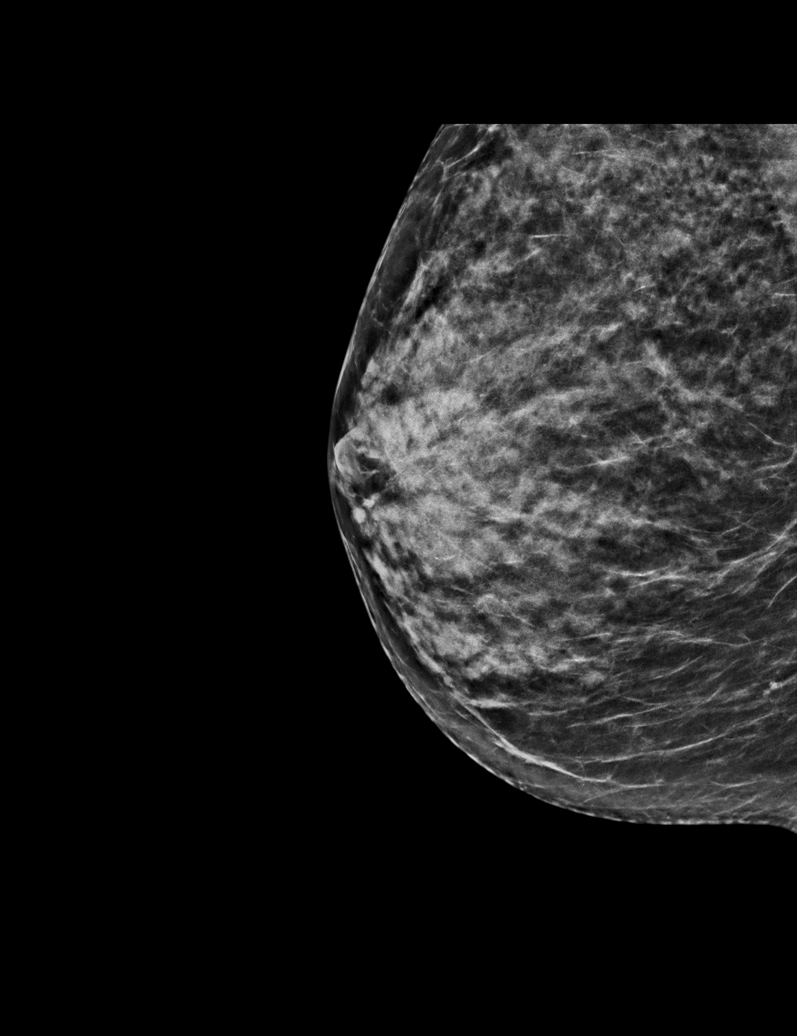

[L CC synth-2D]
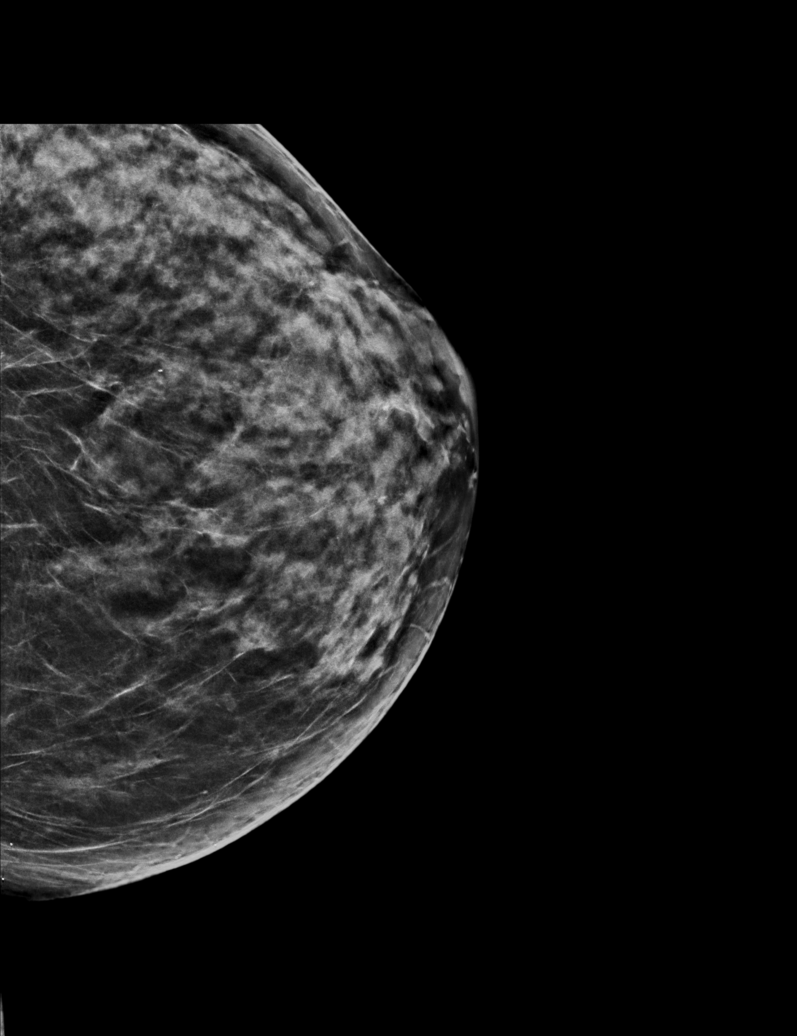

[R MLO synth-2D (2 of 2)]
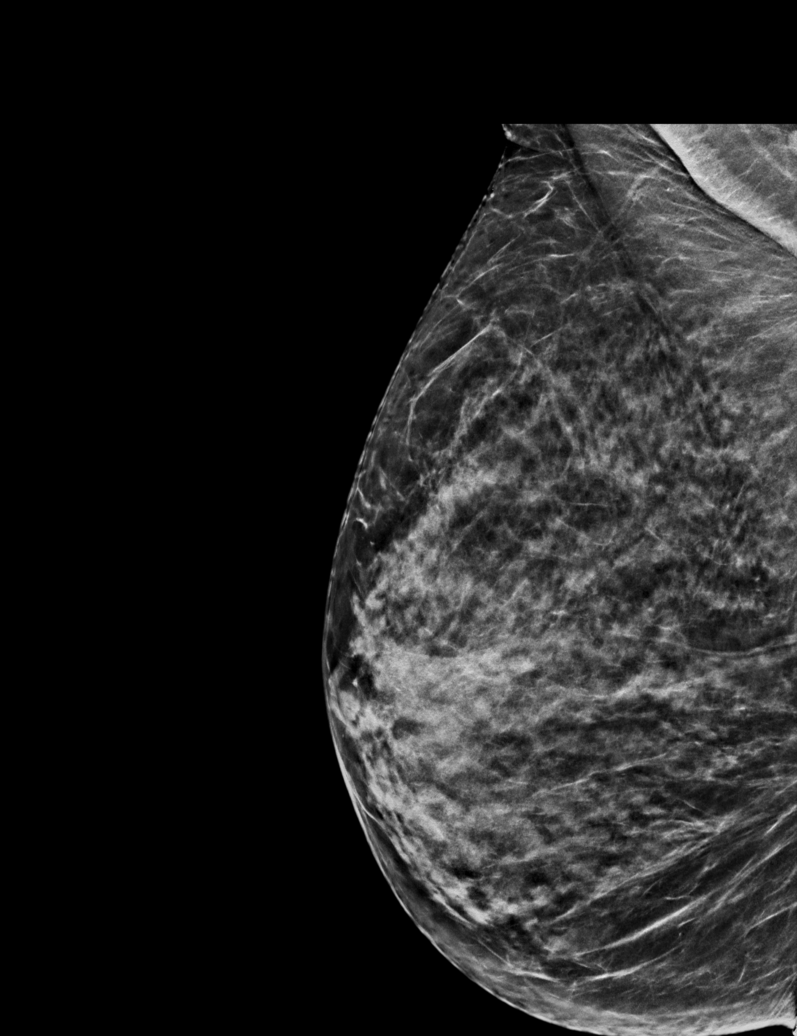

[R CC tomo · tomo slice 29/57.0]
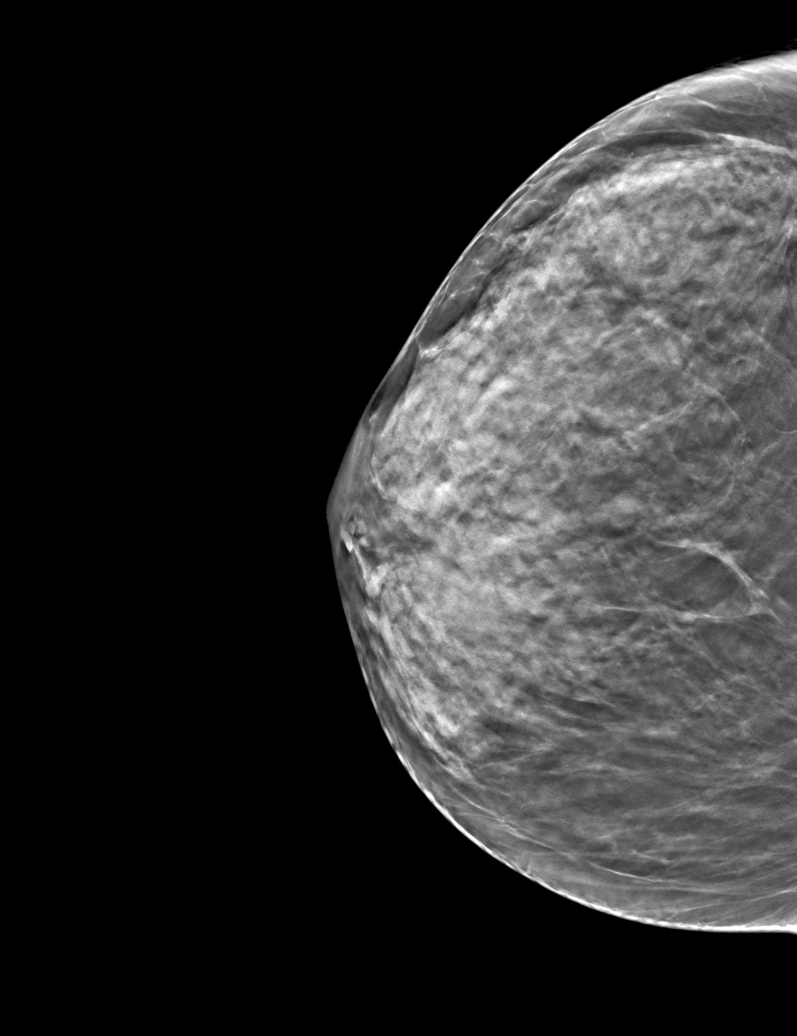

[6 of 30 positions shown; findings below may reference images not displayed]

ACR Breast Density Category d: The breast tissue is extremely dense,
which lowers the sensitivity of mammography
FINDINGS: There are no findings suspicious for malignancy.
IMPRESSION: No mammographic evidence of malignancy. A result letter of this
screening mammogram will be mailed directly to the patient.

RECOMMENDATION:
Screening mammogram in one year. (Code:TA-V-WV9)

BI-RADS CATEGORY  1: Negative.

## 2023-09-30 ENCOUNTER — Encounter (HOSPITAL_COMMUNITY): Payer: Self-pay

## 2023-10-01 ENCOUNTER — Inpatient Hospital Stay (HOSPITAL_COMMUNITY): Admission: RE | Admit: 2023-10-01 | Payer: BC Managed Care – PPO | Source: Ambulatory Visit

## 2023-10-01 ENCOUNTER — Encounter (HOSPITAL_COMMUNITY): Payer: Self-pay

## 2023-10-01 DIAGNOSIS — Z1231 Encounter for screening mammogram for malignant neoplasm of breast: Secondary | ICD-10-CM
